# Patient Record
Sex: Male | Born: 1995 | Race: White | Hispanic: No | Marital: Single | Smoking: Never smoker
Health system: Southern US, Community
[De-identification: ages and names within clinical notes are randomized; demographics above are authoritative.]

---
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
## 2018-10-24 ENCOUNTER — Ambulatory Visit (INDEPENDENT_AMBULATORY_CARE_PROVIDER_SITE_OTHER): Payer: Self-pay | Admitting: Urology

## 2018-10-24 ENCOUNTER — Encounter: Payer: Self-pay | Admitting: Urology

## 2018-10-24 ENCOUNTER — Other Ambulatory Visit: Payer: Self-pay

## 2018-10-24 VITALS — BP 150/99 | HR 66 | Ht 70.0 in | Wt 185.0 lb

## 2018-10-24 DIAGNOSIS — N503 Cyst of epididymis: Secondary | ICD-10-CM

## 2018-10-24 NOTE — Progress Notes (Signed)
   10/24/2018 12:15 PM   Mark Griffin [birth year] [id number]  Referring provider: Corky Downs, MD [address] [place], [place] [address]  CC: Right scrotal cyst  HPI: I saw Mark Griffin in urology clinic today in consultation for a right scrotal cyst from Dr. Juel Burrow.  He is a 23 year old very healthy man who has noticed 4 to 5 months of a right scrotal cyst.  This is freely mobile and nontender and external to the right testicle.  It is about 1 cm in size.  It is not bothersome to him.  It is not painful.  He denies any changes in urinary symptoms.  Denies weight loss, night sweats, gross hematuria.  There is no family history of testicular cancer.  There is no imaging to review.  There are no aggravating or alleviating factors.  Severity is mild.   PMH: None  Surgical History: Denies  Allergies: No Known Allergies  Family History: No family history on file.  Social History:  reports that he has never smoked. He has never used smokeless tobacco. He reports previous alcohol use. No history on file for drug.  ROS: Please see flowsheet from today's date for complete review of systems.  Physical Exam: BP (!) 150/99   Pulse 66   Ht 5\' 10"  (1.778 m)   Wt 185 lb (83.9 kg)   BMI 26.54 kg/m    Constitutional:  Alert and oriented, No acute distress. Cardiovascular: No clubbing, cyanosis, or edema. Respiratory: Normal respiratory effort, no increased work of breathing. GI: Abdomen is soft, nontender, nondistended, no abdominal masses GU: No CVA tenderness, phallus without lesions, widely patent meatus, testicles descended and 20 cc bilaterally, no testicular masses, there is a freely mobile, nontender, right epididymal cyst approximately 1 cm in size Lymph: No cervical or inguinal lymphadenopathy. Skin: No rashes, bruises or suspicious lesions. Neurologic: Grossly intact, no focal deficits, moving all 4 extremities. Psychiatric: Normal mood and affect.  Laboratory Data: None  to review  Pertinent Imaging: None to review  Assessment & Plan:   In summary, the patient is a healthy 23 year old male with 45-month history of what is a likely 1 cm, non-bothersome, right epididymal cyst.  We discussed the differences between a testicular mass and epididymal cysts, and that these typically do not require further surveillance or follow-up.  I did recommend a scrotal ultrasound just to confirm the physical exam findings.  Call with scrotal ultrasound results   Sondra Come, MD  [facility] [address] [place], [place] [address] [phone]

## 2018-10-24 NOTE — Patient Instructions (Signed)
Spermatocele    A spermatocele is a fluid-filled sac (cyst) inside the scrotum. This type of cyst often forms at the top of the testicle where sperm is stored (epididymis). The cyst sometimes forms along the tube that carries sperm away from the epididymis (vas deferens).  Spermatoceles are usually painless. Most cysts are small, but they can grow larger. Spermatoceles are not cancerous (are benign).  What are the causes?  The cause of this condition is not known.  What are the signs or symptoms?  In most cases, small cysts do not cause symptoms. If you do have symptoms, they may include:   Dull pain.   A feeling of heaviness.   An enlargement of your scrotum, if the cyst is large.  How is this diagnosed?  This condition is diagnosed based on a physical exam. You or your health care provider may notice the cyst when feeling your scrotum. Your provider may shine a light through (transilluminate) your scrotum to see if light will pass through the cyst. You may also have an ultrasound of your scrotum to rule out a tumor.  How is this treated?  Small spermatoceles do not need to be treated. If the spermatocele has grown large or is uncomfortable, surgery to remove the cyst may be recommended.  Follow these instructions at home:   Watch your spermatocele for any changes.   Keep all follow-up visits as told by your health care provider. This is important.  Contact a health care provider if:   Your spermatocele gets larger.   You have pain in your scrotum.   Your spermatocele comes back after treatment.  This information is not intended to replace advice given to you by your health care provider. Make sure you discuss any questions you have with your health care provider.  Document Released: 11/23/2015 Document Revised: 03/28/2016 Document Reviewed: 08/16/2015  Elsevier Interactive Patient Education  2019 [employer]

## 2021-10-19 ENCOUNTER — Ambulatory Visit (INDEPENDENT_AMBULATORY_CARE_PROVIDER_SITE_OTHER): Payer: BC Managed Care – PPO | Admitting: Internal Medicine

## 2021-10-19 ENCOUNTER — Other Ambulatory Visit: Payer: Self-pay

## 2021-10-19 ENCOUNTER — Encounter: Payer: Self-pay | Admitting: Internal Medicine

## 2021-10-19 VITALS — BP 168/102 | HR 71 | Ht 70.0 in | Wt 200.6 lb

## 2021-10-19 DIAGNOSIS — E54 Ascorbic acid deficiency: Secondary | ICD-10-CM | POA: Diagnosis not present

## 2021-10-19 DIAGNOSIS — R079 Chest pain, unspecified: Secondary | ICD-10-CM | POA: Diagnosis not present

## 2021-10-19 MED ORDER — ATENOLOL 25 MG PO TABS: 25.0000 mg | ORAL_TABLET | Freq: Every day | ORAL | 3 refills | Status: AC

## 2021-10-19 NOTE — Assessment & Plan Note (Signed)
We will order an echocardiogram ?

## 2021-10-19 NOTE — Progress Notes (Addendum)
? ?Established Patient Office Visit ? ?Subjective:  ?Patient ID: Mark Griffin, male    DOB: 1995/12/22  Age: 26 y.o. MRN: [id number] ? ?CC:  ?Chief Complaint  ?Patient presents with  ? chest pains  ?  Patient has been having chest pains on and off for the past 3 weeks. Patient states that he has been having sob and complains of left arm tingling and feeling numb.   ? ? ?Chest Pain  ?This is a new problem. The current episode started 1 to 4 weeks ago. The onset quality is undetermined. The problem occurs 2 to 4 times per day. The problem has been waxing and waning. The pain is at a severity of 6/10. The quality of the pain is described as stabbing. Pertinent negatives include no abdominal pain, back pain, cough, diaphoresis, dizziness, malaise/fatigue, orthopnea, palpitations, syncope or vomiting.  ?Pertinent negatives for past medical history include no anxiety/panic attacks.  ? ?ALEJOS Griffin presents for chest pain ? ?History reviewed. No pertinent past medical history. ? ?History reviewed. No pertinent surgical history. ? ?Family History  ?Problem Relation Age of Onset  ? Hypertension Mother   ? Hypertension Father   ? Kidney disease Maternal Uncle   ? Cancer Maternal Grandmother   ? Cancer Maternal Grandfather   ? ? ?Social History  ? ?Socioeconomic History  ? Marital status: Single  ?  Spouse name: Not on file  ? Number of children: Not on file  ? Years of education: Not on file  ? Highest education level: Not on file  ?Occupational History  ? Not on file  ?Tobacco Use  ? Smoking status: Never  ? Smokeless tobacco: Never  ?Vaping Use  ? Vaping Use: Former  ?Substance and Sexual Activity  ? Alcohol use: Not Currently  ? Drug use: Never  ? Sexual activity: Not on file  ?Other Topics Concern  ? Not on file  ?Social History Narrative  ? Not on file  ? ?Social Determinants of Health  ? ?Financial Resource Strain: Not on file  ?Food Insecurity: Not on file  ?Transportation Needs: Not on file  ?Physical  Activity: Not on file  ?Stress: Not on file  ?Social Connections: Not on file  ?Intimate Partner Violence: Not on file  ? ? ?No current outpatient medications on file.  ? ?No Known Allergies ? ?ROS ?Review of Systems  ?Constitutional: Negative.  Negative for diaphoresis and malaise/fatigue.  ?HENT: Negative.    ?Eyes: Negative.   ?Respiratory: Negative.  Negative for cough.   ?Cardiovascular:  Positive for chest pain. Negative for palpitations, orthopnea and syncope.  ?Gastrointestinal: Negative.  Negative for abdominal pain and vomiting.  ?Endocrine: Negative.   ?Genitourinary: Negative.   ?Musculoskeletal: Negative.  Negative for back pain.  ?Skin: Negative.   ?Allergic/Immunologic: Negative.   ?Neurological: Negative.  Negative for dizziness.  ?Hematological: Negative.   ?Psychiatric/Behavioral: Negative.    ?All other systems reviewed and are negative. ? ?  ?Objective:  ?  ?Physical Exam ?Vitals reviewed.  ?Constitutional:   ?   Appearance: Normal appearance.  ?HENT:  ?   Mouth/Throat:  ?   Mouth: Mucous membranes are moist.  ?Eyes:  ?   Pupils: Pupils are equal, round, and reactive to light.  ?Neck:  ?   Vascular: No carotid bruit.  ?Cardiovascular:  ?   Rate and Rhythm: Normal rate and regular rhythm.  ?   Pulses: Normal pulses.  ?   Heart sounds: Normal heart sounds and S2 normal. No  murmur heard. ?   Comments: Mid systolic  click ?Pulmonary:  ?   Effort: Pulmonary effort is normal.  ?   Breath sounds: Normal breath sounds.  ?Abdominal:  ?   General: Bowel sounds are normal.  ?   Palpations: Abdomen is soft. There is no hepatomegaly, splenomegaly or mass.  ?   Tenderness: There is no abdominal tenderness.  ?   Hernia: No hernia is present.  ?Musculoskeletal:  ?   Cervical back: Neck supple.  ?   Right lower leg: No edema.  ?   Left lower leg: No edema.  ?Skin: ?   Findings: No rash.  ?Neurological:  ?   Mental Status: He is alert and oriented to person, place, and time.  ?   Motor: No weakness.  ?Psychiatric:      ?   Mood and Affect: Mood normal.     ?   Behavior: Behavior normal.  ? ? ?BP (!) 168/102   Pulse 71   Ht 5\' 10"  (1.778 m)   Wt 200 lb 9.6 oz (91 kg)   BMI 28.78 kg/m?  ?Wt Readings from Last 3 Encounters:  ?10/19/21 200 lb 9.6 oz (91 kg)  ?10/24/18 185 lb (83.9 kg)  ? ? ? ?Health Maintenance Due  ?Topic Date Due  ? COVID-19 Vaccine (1) Never done  ? HIV Screening  Never done  ? Hepatitis C Screening  Never done  ? TETANUS/TDAP  Never done  ? INFLUENZA VACCINE  Never done  ? ? ?There are no preventive care reminders to display for this patient. ? ?No results found for: TSH ?Lab Results  ?Component Value Date  ? WBC 5.9 07/24/2012  ? HGB 16.1 07/24/2012  ? HCT 45.8 07/24/2012  ? MCV 87 07/24/2012  ? PLT 212 07/24/2012  ? ?Lab Results  ?Component Value Date  ? NA 135 07/24/2012  ? K 3.8 07/24/2012  ? CO2 25 07/24/2012  ? GLUCOSE 107 (H) 07/24/2012  ? BUN 12 07/24/2012  ? CREATININE 1.19 07/24/2012  ? BILITOT 0.5 07/24/2012  ? ALKPHOS 82 (L) 07/24/2012  ? AST 30 07/24/2012  ? ALT 37 07/24/2012  ? PROT 8.0 07/24/2012  ? ALBUMIN 4.4 07/24/2012  ? CALCIUM 9.4 07/24/2012  ? ANIONGAP 10 07/24/2012  ? ?No results found for: CHOL ?No results found for: HDL ?No results found for: LDLCALC ?No results found for: TRIG ?No results found for: CHOLHDL ?No results found for: HGBA1C ? ?  ?Assessment & Plan:  ? ?Problem List Items Addressed This Visit   ? ?  ? 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  We will order an echocardiogram ?  ?  ? Chest pain - Primary  ?  I will order a chest x-ray and CBC and metabolic panel, Notes from 2013 reviewed ?  ?  ? Relevant Orders  ? EKG 12-Lead  ? ?Report of the electrocardiogram. ?Normalsinusrhythm short PR interval, nonspecific ST abnormalities, no acute changes are noted   ? ? ?No orders of the defined types were placed in this encounter. ? ? ?Follow-up: No follow-ups on file.  ? ? ?2014, MD ?

## 2021-10-19 NOTE — Assessment & Plan Note (Signed)
I will order a chest x-ray and CBC and metabolic panel, Notes from 2013 reviewed ?

## 2021-10-22 ENCOUNTER — Other Ambulatory Visit (INDEPENDENT_AMBULATORY_CARE_PROVIDER_SITE_OTHER): Payer: BC Managed Care – PPO

## 2021-10-22 ENCOUNTER — Other Ambulatory Visit: Payer: Self-pay

## 2021-10-22 DIAGNOSIS — R079 Chest pain, unspecified: Secondary | ICD-10-CM

## 2021-10-23 LAB — CBC WITH DIFFERENTIAL/PLATELET
Absolute Monocytes: 583 cells/uL (ref 200–950)
Basophils Absolute: 80 cells/uL (ref 0–200)
Basophils Relative: 1.2 %
Eosinophils Absolute: 308 cells/uL (ref 15–500)
Eosinophils Relative: 4.6 %
HCT: 46.9 % (ref 38.5–50.0)
Hemoglobin: 15.9 g/dL (ref 13.2–17.1)
Lymphs Abs: 2868 cells/uL (ref 850–3900)
MCH: 30.1 pg (ref 27.0–33.0)
MCHC: 33.9 g/dL (ref 32.0–36.0)
MCV: 88.7 fL (ref 80.0–100.0)
MPV: 9.9 fL (ref 7.5–12.5)
Monocytes Relative: 8.7 %
Neutro Abs: 2861 cells/uL (ref 1500–7800)
Neutrophils Relative %: 42.7 %
Platelets: 326 10*3/uL (ref 140–400)
RBC: 5.29 10*6/uL (ref 4.20–5.80)
RDW: 12.9 % (ref 11.0–15.0)
Total Lymphocyte: 42.8 %
WBC: 6.7 10*3/uL (ref 3.8–10.8)

## 2021-10-23 LAB — COMPLETE METABOLIC PANEL WITH GFR
AG Ratio: 1.8 (calc) (ref 1.0–2.5)
ALT: 31 U/L (ref 9–46)
AST: 20 U/L (ref 10–40)
Albumin: 4.4 g/dL (ref 3.6–5.1)
Alkaline phosphatase (APISO): 57 U/L (ref 36–130)
BUN: 16 mg/dL (ref 7–25)
CO2: 26 mmol/L (ref 20–32)
Calcium: 9.2 mg/dL (ref 8.6–10.3)
Chloride: 103 mmol/L (ref 98–110)
Creat: 1.15 mg/dL (ref 0.60–1.24)
Globulin: 2.5 g/dL (calc) (ref 1.9–3.7)
Glucose, Bld: 88 mg/dL (ref 65–99)
Potassium: 4 mmol/L (ref 3.5–5.3)
Sodium: 138 mmol/L (ref 135–146)
Total Bilirubin: 0.3 mg/dL (ref 0.2–1.2)
Total Protein: 6.9 g/dL (ref 6.1–8.1)
eGFR: 89 mL/min/{1.73_m2} (ref >=60)

## 2021-10-23 LAB — LIPID PANEL
Cholesterol: 222 mg/dL — ABNORMAL HIGH (ref <200)
HDL: 26 mg/dL — ABNORMAL LOW (ref >=40)
LDL Cholesterol (Calc): 167 mg/dL (calc) — ABNORMAL HIGH
Non-HDL Cholesterol (Calc): 196 mg/dL (calc) — ABNORMAL HIGH (ref <130)
Total CHOL/HDL Ratio: 8.5 (calc) — ABNORMAL HIGH (ref <5.0)
Triglycerides: 148 mg/dL (ref <150)

## 2021-10-23 LAB — TSH: TSH: 3.49 mIU/L (ref 0.40–4.50)

## 2021-10-25 ENCOUNTER — Other Ambulatory Visit: Payer: Self-pay

## 2021-10-25 ENCOUNTER — Other Ambulatory Visit (INDEPENDENT_AMBULATORY_CARE_PROVIDER_SITE_OTHER): Payer: BC Managed Care – PPO

## 2021-10-25 DIAGNOSIS — R0781 Pleurodynia: Secondary | ICD-10-CM

## 2021-10-25 DIAGNOSIS — E54 Ascorbic acid deficiency: Secondary | ICD-10-CM

## 2021-10-25 NOTE — Progress Notes (Unsigned)
? ?Established Patient Office Visit ? ?Subjective:  ?Patient ID: AKSEL BENCOMO, male    DOB: 14-Sep-1995  Age: 26 y.o. MRN: [id number] ? ?CC: No chief complaint on file. ? ? ?HPI ? ?Mellody Memos presents for echo, c/o atypical  chest pain ? ?No past medical history on file. ? ?No past surgical history on file. ? ?Family History  ?Problem Relation Age of Onset  ? Hypertension Mother   ? Hypertension Father   ? Kidney disease Maternal Uncle   ? Cancer Maternal Grandmother   ? Cancer Maternal Grandfather   ? ? ?Social History  ? ?Socioeconomic History  ? Marital status: Single  ?  Spouse name: Not on file  ? Number of children: Not on file  ? Years of education: Not on file  ? Highest education level: Not on file  ?Occupational History  ? Not on file  ?Tobacco Use  ? Smoking status: Never  ? Smokeless tobacco: Never  ?Vaping Use  ? Vaping Use: Former  ?Substance and Sexual Activity  ? Alcohol use: Not Currently  ? Drug use: Never  ? Sexual activity: Not on file  ?Other Topics Concern  ? Not on file  ?Social History Narrative  ? Not on file  ? ?Social Determinants of Health  ? ?Financial Resource Strain: Not on file  ?Food Insecurity: Not on file  ?Transportation Needs: Not on file  ?Physical Activity: Not on file  ?Stress: Not on file  ?Social Connections: Not on file  ?Intimate Partner Violence: Not on file  ? ? ? ?Current Outpatient Medications:  ?  atenolol (TENORMIN) 25 MG tablet, Take 1 tablet (25 mg total) by mouth daily., Disp: 90 tablet, Rfl: 3  ? ?No Known Allergies ? ?ROS ?Review of Systems  ?Constitutional: Negative.   ?HENT: Negative.    ?Eyes: Negative.   ?Respiratory: Negative.    ?Cardiovascular: Negative.   ?Gastrointestinal: Negative.   ?Endocrine: Negative.   ?Genitourinary: Negative.   ?Musculoskeletal: Negative.   ?Skin: Negative.   ?Allergic/Immunologic: Negative.   ?Neurological: Negative.   ?Hematological: Negative.   ?Psychiatric/Behavioral: Negative.    ?All other systems reviewed  and are negative. ? ?  ?Objective:  ?  ?Physical Exam ?Vitals reviewed.  ?Constitutional:   ?   Appearance: Normal appearance.  ?HENT:  ?   Mouth/Throat:  ?   Mouth: Mucous membranes are moist.  ?Eyes:  ?   Pupils: Pupils are equal, round, and reactive to light.  ?Neck:  ?   Vascular: No carotid bruit.  ?Cardiovascular:  ?   Rate and Rhythm: Normal rate and regular rhythm.  ?   Pulses: Normal pulses.  ?   Heart sounds: Normal heart sounds.  ?Pulmonary:  ?   Effort: Pulmonary effort is normal.  ?   Breath sounds: Normal breath sounds.  ?Abdominal:  ?   General: Bowel sounds are normal.  ?   Palpations: Abdomen is soft. There is no hepatomegaly, splenomegaly or mass.  ?   Tenderness: There is no abdominal tenderness.  ?   Hernia: No hernia is present.  ?Musculoskeletal:  ?   Cervical back: Neck supple.  ?   Right lower leg: No edema.  ?   Left lower leg: No edema.  ?Skin: ?   Findings: No rash.  ?Neurological:  ?   Mental Status: He is alert and oriented to person, place, and time.  ?   Motor: No weakness.  ?Psychiatric:     ?   Mood  and Affect: Mood normal.     ?   Behavior: Behavior normal.  ? ? ?There were no vitals taken for this visit. ?Wt Readings from Last 3 Encounters:  ?10/19/21 200 lb 9.6 oz (91 kg)  ?10/24/18 185 lb (83.9 kg)  ? ? ? ?Health Maintenance Due  ?Topic Date Due  ? COVID-19 Vaccine (1) Never done  ? HIV Screening  Never done  ? Hepatitis C Screening  Never done  ? TETANUS/TDAP  Never done  ? INFLUENZA VACCINE  Never done  ? ? ?There are no preventive care reminders to display for this patient. ? ?Lab Results  ?Component Value Date  ? TSH 3.49 10/22/2021  ? ?Lab Results  ?Component Value Date  ? WBC 6.7 10/22/2021  ? HGB 15.9 10/22/2021  ? HCT 46.9 10/22/2021  ? MCV 88.7 10/22/2021  ? PLT 326 10/22/2021  ? ?Lab Results  ?Component Value Date  ? NA 138 10/22/2021  ? K 4.0 10/22/2021  ? CO2 26 10/22/2021  ? GLUCOSE 88 10/22/2021  ? BUN 16 10/22/2021  ? CREATININE 1.15 10/22/2021  ? BILITOT 0.3  10/22/2021  ? ALKPHOS 82 (L) 07/24/2012  ? AST 20 10/22/2021  ? ALT 31 10/22/2021  ? PROT 6.9 10/22/2021  ? ALBUMIN 4.4 07/24/2012  ? CALCIUM 9.2 10/22/2021  ? ANIONGAP 10 07/24/2012  ? EGFR 89 10/22/2021  ? ?Lab Results  ?Component Value Date  ? CHOL 222 (H) 10/22/2021  ? ?Lab Results  ?Component Value Date  ? HDL 26 (L) 10/22/2021  ? ?Lab Results  ?Component Value Date  ? LDLCALC 167 (H) 10/22/2021  ? ?Lab Results  ?Component Value Date  ? TRIG 148 10/22/2021  ? ?Lab Results  ?Component Value Date  ? CHOLHDL 8.5 (H) 10/22/2021  ? ?No results found for: HGBA1C ? ?  ?Assessment & Plan:  ? ?Problem List Items Addressed This Visit   ?None ? ?[facility] ?[place] ?[place], [place] [address] ?Phone: [phone] ?Fax:  [phone] ? ?Transthoracic Echocardiogram Note ? ?BERK PILOT ?[id number] ?[birth year] ? ?Procedure: Transthoracic Echocardiogram ?Indications: Chest pain ?Verbal Consent: Obtained ? ?Procedure Details ? ? ?Technical quality: good ? ?Resting Measurements: Within normal range ? ?Left Ventrical: Normal size and normal function ejection fraction 55% ? ?Mitral Valve: Normal no evidence of stenosis ? ?Aortic Valve: Normal without any sclerosis ? ?Tricuspid Valve: Normal ? ?Pulmonic Valve: Normal ? ?Left Atrium/ Left atrial appendage: Normal size without any blood clot ? ?Atrial septum:  ? ?Aorta: Aortic root is normal ? ? ?Complications: No apparent complications ?Patient did tolerate procedure well. ? ?Cletis Athens, MD  ? ?No orders of the defined types were placed in this encounter. ? ? ?Follow-up: No follow-ups on file.  ? ? ?Cletis Athens, MD ?

## 2021-11-03 ENCOUNTER — Other Ambulatory Visit: Payer: Self-pay

## 2021-11-03 ENCOUNTER — Encounter: Payer: Self-pay | Admitting: Internal Medicine

## 2021-11-03 ENCOUNTER — Ambulatory Visit (INDEPENDENT_AMBULATORY_CARE_PROVIDER_SITE_OTHER): Payer: BC Managed Care – PPO | Admitting: Internal Medicine

## 2021-11-03 DIAGNOSIS — R0781 Pleurodynia: Secondary | ICD-10-CM | POA: Diagnosis not present

## 2021-11-03 DIAGNOSIS — E54 Ascorbic acid deficiency: Secondary | ICD-10-CM

## 2021-11-03 NOTE — Assessment & Plan Note (Signed)
No acute changes noted on the stress test no arrhythmia was seen, ?

## 2021-11-03 NOTE — Progress Notes (Signed)
? ?Established Patient Office Visit ? ?Subjective:  ?Patient ID: Mark Griffin, male    DOB: 20-Dec-1995  Age: 26 y.o. MRN: [id number] ? ?CC: No chief complaint on file. ? ? ?HPI ? ?Mellody Memos presents for a stress test. ? ?No past medical history on file. ? ?No past surgical history on file. ? ?Family History  ?Problem Relation Age of Onset  ? Hypertension Mother   ? Hypertension Father   ? Kidney disease Maternal Uncle   ? Cancer Maternal Grandmother   ? Cancer Maternal Grandfather   ? ? ?Social History  ? ?Socioeconomic History  ? Marital status: Single  ?  Spouse name: Not on file  ? Number of children: Not on file  ? Years of education: Not on file  ? Highest education level: Not on file  ?Occupational History  ? Not on file  ?Tobacco Use  ? Smoking status: Never  ? Smokeless tobacco: Never  ?Vaping Use  ? Vaping Use: Former  ?Substance and Sexual Activity  ? Alcohol use: Not Currently  ? Drug use: Never  ? Sexual activity: Not on file  ?Other Topics Concern  ? Not on file  ?Social History Narrative  ? Not on file  ? ?Social Determinants of Health  ? ?Financial Resource Strain: Not on file  ?Food Insecurity: Not on file  ?Transportation Needs: Not on file  ?Physical Activity: Not on file  ?Stress: Not on file  ?Social Connections: Not on file  ?Intimate Partner Violence: Not on file  ? ? ? ?Current Outpatient Medications:  ?  atenolol (TENORMIN) 25 MG tablet, Take 1 tablet (25 mg total) by mouth daily., Disp: 90 tablet, Rfl: 3  ? ?No Known Allergies ? ?ROS ?Review of Systems  ?Constitutional: Negative.   ?HENT: Negative.    ?Eyes: Negative.   ?Respiratory: Negative.    ?Cardiovascular: Negative.   ?Gastrointestinal: Negative.   ?Endocrine: Negative.   ?Genitourinary: Negative.   ?Musculoskeletal: Negative.   ?Skin: Negative.   ?Allergic/Immunologic: Negative.   ?Neurological: Negative.   ?Hematological: Negative.   ?Psychiatric/Behavioral: Negative.    ?All other systems reviewed and are  negative. ? ?  ?Objective:  ?  ?Physical Exam ?Vitals reviewed.  ?Constitutional:   ?   Appearance: Normal appearance.  ?HENT:  ?   Mouth/Throat:  ?   Mouth: Mucous membranes are moist.  ?Eyes:  ?   Pupils: Pupils are equal, round, and reactive to light.  ?Neck:  ?   Vascular: No carotid bruit.  ?Cardiovascular:  ?   Rate and Rhythm: Normal rate and regular rhythm.  ?   Pulses: Normal pulses.  ?   Heart sounds: Normal heart sounds.  ?Pulmonary:  ?   Effort: Pulmonary effort is normal.  ?   Breath sounds: Normal breath sounds.  ?Abdominal:  ?   General: Bowel sounds are normal.  ?   Palpations: Abdomen is soft. There is no hepatomegaly, splenomegaly or mass.  ?   Tenderness: There is no abdominal tenderness.  ?   Hernia: No hernia is present.  ?Musculoskeletal:  ?   Cervical back: Neck supple.  ?   Right lower leg: No edema.  ?   Left lower leg: No edema.  ?Skin: ?   Findings: No rash.  ?Neurological:  ?   Mental Status: He is alert and oriented to person, place, and time.  ?   Motor: No weakness.  ?Psychiatric:     ?   Mood and Affect: Mood  normal.     ?   Behavior: Behavior normal.  ? ? ?There were no vitals taken for this visit. ?Wt Readings from Last 3 Encounters:  ?10/19/21 200 lb 9.6 oz (91 kg)  ?10/24/18 185 lb (83.9 kg)  ? ? ? ?Health Maintenance Due  ?Topic Date Due  ? COVID-19 Vaccine (1) Never done  ? HIV Screening  Never done  ? Hepatitis C Screening  Never done  ? TETANUS/TDAP  Never done  ? INFLUENZA VACCINE  Never done  ? ? ?There are no preventive care reminders to display for this patient. ? ?Lab Results  ?Component Value Date  ? TSH 3.49 10/22/2021  ? ?Lab Results  ?Component Value Date  ? WBC 6.7 10/22/2021  ? HGB 15.9 10/22/2021  ? HCT 46.9 10/22/2021  ? MCV 88.7 10/22/2021  ? PLT 326 10/22/2021  ? ?Lab Results  ?Component Value Date  ? NA 138 10/22/2021  ? K 4.0 10/22/2021  ? CO2 26 10/22/2021  ? GLUCOSE 88 10/22/2021  ? BUN 16 10/22/2021  ? CREATININE 1.15 10/22/2021  ? BILITOT 0.3 10/22/2021  ?  ALKPHOS 82 (L) 07/24/2012  ? AST 20 10/22/2021  ? ALT 31 10/22/2021  ? PROT 6.9 10/22/2021  ? ALBUMIN 4.4 07/24/2012  ? CALCIUM 9.2 10/22/2021  ? ANIONGAP 10 07/24/2012  ? EGFR 89 10/22/2021  ? ?Lab Results  ?Component Value Date  ? CHOL 222 (H) 10/22/2021  ? ?Lab Results  ?Component Value Date  ? HDL 26 (L) 10/22/2021  ? ?Lab Results  ?Component Value Date  ? LDLCALC 167 (H) 10/22/2021  ? ?Lab Results  ?Component Value Date  ? TRIG 148 10/22/2021  ? ?Lab Results  ?Component Value Date  ? CHOLHDL 8.5 (H) 10/22/2021  ? ?No results found for: HGBA1C ? ?  ?Assessment & Plan:  ? ?  ?PATIENT ID: Mellody Memos, male     DOB: October 21, 1995, 26 y.o.     MRN: [id number] ? ? ?Procedure Note ?Stress Test ? ?Reason for Study: ?Chest pain Barlow syndrome ? ? ? ?Study:  Treadmill Stress Test  ?Pre-test ECG: Normal; normal sinus rhythm.  ?Level of Stress: 100% age-predicted max HR ?  METS achieved 6  ?Functional Capacity:  normal  ?Abnormal Symptoms:  none  ?Heart Rate Response:  normal  ?BP Response:   normal  ?Stress ECG:  no ECG changes; normal EKG, normal sinus rhythm, unchanged from previous tracings, normal sinus rhythm.  ? ?Impression:   ?Normal test.  ? ?The patient was exercised according to the Bruce protocol.  Exercise test was stopped because of tiredness he did not have any chest pain did not have any arrhythmia, functional capacity is good, no arrhythmia was noted, ?Blood pressure acutely 170/80 maximum heart rate achieved is 140.  No ST changes suggestive of myocardial ischemia was noted. ? ? ?Interpreted by: Cletis Athens, MD 11/03/21 10:32 AM  ? ?  ? ? ? ? ?Problem List Items Addressed This Visit   ?None ? ? ?No orders of the defined types were placed in this encounter. ? ? ?Follow-up: No follow-ups on file.  ? ? ?Cletis Athens, MD ?

## 2021-11-03 NOTE — Assessment & Plan Note (Signed)
Resolved.  Patient was advised to follow low-cholesterol diet. ?

## 2021-11-15 ENCOUNTER — Ambulatory Visit: Payer: BC Managed Care – PPO | Admitting: Internal Medicine

## 2021-11-16 ENCOUNTER — Other Ambulatory Visit: Payer: Self-pay
